# Patient Record
Sex: Male | Born: 1995 | Race: Black or African American | Hispanic: No | Marital: Married
Health system: Southern US, Community
[De-identification: ages and names within clinical notes are randomized; demographics above are authoritative.]

---
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
## 2017-04-26 ENCOUNTER — Ambulatory Visit: Payer: Self-pay | Admitting: Nurse Practitioner

## 2017-04-26 VITALS — BP 128/84 | HR 88 | Temp 98.3°F | Resp 20 | Wt 357.6 lb

## 2017-04-26 DIAGNOSIS — L02416 Cutaneous abscess of left lower limb: Secondary | ICD-10-CM

## 2017-04-26 MED ORDER — SULFAMETHOXAZOLE-TRIMETHOPRIM 800-160 MG PO TABS: 1.0000 | ORAL_TABLET | Freq: Two times a day (BID) | ORAL | 0 refills | Status: AC

## 2017-04-26 MED ORDER — MUPIROCIN 2 % EX OINT: 1.0000 "application " | TOPICAL_OINTMENT | Freq: Two times a day (BID) | CUTANEOUS | 0 refills | Status: AC

## 2017-04-26 MED ORDER — CEPHALEXIN 500 MG PO CAPS: 500.0000 mg | ORAL_CAPSULE | Freq: Three times a day (TID) | ORAL | 0 refills | Status: AC

## 2017-04-26 NOTE — Patient Instructions (Addendum)

## 2017-04-26 NOTE — Progress Notes (Signed)
Subjective:    Anthony Lozano is a 22 y.o. male who presents for evaluation of a possible skin infection located posterior left knee/leg. Symptoms include mild pain and erythema located to back of left knee. Patient denies chills and fever greater than 100. Precipitating event: patient worked at [employer] prior to noticing symptoms. Treatment to date has included warm compresses and dressing applied with minimal relief.  Patient states he noticed something that looked like a "pimple" behind the left knee about 2 days ago. Site began draining x 1 day.   The following portions of the patient's history were reviewed and updated as appropriate: allergies, current medications and past medical history.  Review of Systems Constitutional: negative Eyes: negative Ears, nose, mouth, throat, and face: negative Respiratory: negative Cardiovascular: negative Gastrointestinal: negative Integument/breast: positive for abscess to left posterior knee Musculoskeletal:negative Behavioral/Psych: negative     Objective:    BP 128/84 (BP Location: Right Arm, Patient Position: Sitting, Cuff Size: Normal)   Pulse 88   Temp 98.3 F (36.8 C) (Oral)   Resp 20   Wt (!) 357 lb 9.6 oz (162.2 kg)   SpO2 98%  General appearance: alert, cooperative and no distress Head: Normocephalic, without obvious abnormality, atraumatic Eyes: conjunctivae/corneas clear. PERRL, EOM's intact. Fundi benign. Ears: normal TM's and external ear canals both ears Nose: Nares normal. Septum midline. Mucosa normal. No drainage or sinus tenderness. Throat: lips, mucosa, and tongue normal; teeth and gums normal Lungs: clear to auscultation bilaterally Heart: regular rate and rhythm, S1, S2 normal, no murmur, click, rub or gallop Abdomen: soft, non-tender; bowel sounds normal; no masses,  no organomegaly Extremities: extremities normal, atraumatic, no cyanosis or edema and 3-4 cm abscess to posterior left knee, currently draining  serosanguinous fluid, no order, area surrounding wound, warm to palpation, site is firm, no fluctuance. No malodorous drainage observed. Pulses: 2+ and symmetric Skin: abscess to posterior left knee Lymph nodes: cervical and submandibular nodes normal     Assessment:    Skin Abscess to Left Lower Extremity  Plan:    Bactrim, Keflex and Mupirocin prescribed. Agricultural engineerducational material distributed. Pain medication: Ibuprofen or Tylenol. Wound dressing applied. Follow up in 2 days for wound check. Patient instructed to use Hibiclens soap or Dial antibacterial soap to clean site twice daily.  Patient to apply clean dressings to site daily.  Discussed with patient indications to go to ER, fever, chills, foul smelling drainage, streaking or other concerns.  Patient verbalized understanding.

## 2017-04-28 ENCOUNTER — Telehealth: Payer: Self-pay | Admitting: Emergency Medicine

## 2017-11-20 NOTE — Telephone Encounter (Signed)
error 

## 2017-12-23 ENCOUNTER — Emergency Department (HOSPITAL_COMMUNITY): Admission: EM | Admit: 2017-12-23 | Discharge: 2017-12-24 | Discharge status: Against Medical Advice | Payer: Self-pay

## 2017-12-24 NOTE — ED Notes (Signed)
No answer for triage x3 

## 2017-12-29 ENCOUNTER — Ambulatory Visit: Payer: Self-pay | Admitting: Family Medicine

## 2017-12-29 ENCOUNTER — Encounter: Payer: Self-pay | Admitting: Family Medicine

## 2017-12-29 VITALS — BP 125/80 | HR 84 | Temp 99.0°F | Resp 18 | Wt 349.8 lb

## 2017-12-29 DIAGNOSIS — L309 Dermatitis, unspecified: Secondary | ICD-10-CM

## 2017-12-29 MED ORDER — MOMETASONE FUROATE 0.1 % EX CREA: 1.0000 "application " | TOPICAL_CREAM | Freq: Every day | CUTANEOUS | 0 refills | Status: AC

## 2017-12-29 NOTE — Patient Instructions (Signed)

## 2017-12-29 NOTE — Progress Notes (Signed)
Anthony Lozano is a 22 y.o. male who presents today with concerns of eczema that is chronic and has persisted since his youth about 22 years old. He has never had this condition formally evaluated or treated. He reports using moisturizing lotion on this location to treat the dryness for the last few years. He has never had formal diagnosis or treatment of this condition.  Review of Systems  Constitutional: Negative for chills, fever and malaise/fatigue.  HENT: Negative for congestion, ear discharge, ear pain, sinus pain and sore throat.   Eyes: Negative.   Respiratory: Negative for cough, sputum production and shortness of breath.   Cardiovascular: Negative.  Negative for chest pain.  Gastrointestinal: Negative for abdominal pain, diarrhea, nausea and vomiting.  Genitourinary: Negative for dysuria, frequency, hematuria and urgency.  Musculoskeletal: Negative for myalgias.  Skin: Positive for itching and rash.       Chronic skin condition: eczema  Neurological: Negative for headaches.  Endo/Heme/Allergies: Negative.   Psychiatric/Behavioral: Negative.     O: Vitals:   12/29/17 1439  BP: 125/80  Pulse: 84  Resp: 18  Temp: 99 F (37.2 C)  SpO2: 98%     Physical Exam  Constitutional: He is oriented to person, place, and time. Vital signs are normal. He appears well-developed and well-nourished. He is active.  Non-toxic appearance. He does not have a sickly appearance.  HENT:  Head: Normocephalic.  Right Ear: Hearing, tympanic membrane, external ear and ear canal normal.  Left Ear: Hearing, tympanic membrane, external ear and ear canal normal.  Nose: Nose normal.  Mouth/Throat: Uvula is midline and oropharynx is clear and moist.  Neck: Normal range of motion. Neck supple.  Cardiovascular: Normal rate, regular rhythm, normal heart sounds and normal pulses.  Pulmonary/Chest: Effort normal and breath sounds normal.  Abdominal: Soft. Bowel sounds are normal.  Musculoskeletal:  Normal range of motion.  Lymphadenopathy:       Head (right side): No submental and no submandibular adenopathy present.       Head (left side): No submental and no submandibular adenopathy present.    He has no cervical adenopathy.  Neurological: He is alert and oriented to person, place, and time.  Skin: Rash noted.     Patient has macular plaque like hyperpigmentation distributed on his entire upper body arms, chest, and back,. When examined under the UV light there was no indication of a fungal infection. The areas in question on the chest, back  And neck appeared non scaly and flat hyperpigmented resembling a acanthosis nigrans as compared to both of the exposed arms which thick plaque like lichenification was prevalent especially around the wrist area. There was cracking of some areas but all skin appeared intact without bleeding . Also under both armpits in the axilla there was evidence of scarring similar to hidradenitis supperativa and patient reports occasional painful bumps in axilla and groin and between thighs, that may or may not drain and resolve over time. No active lesions in axilla present at this time.  Psychiatric: He has a normal mood and affect.  Vitals reviewed.  A: 1. Eczema, unspecified type    P: Discussed exam findings, diagnosis etiology and medication use and indications reviewed with patient. Follow- Up and discharge instructions provided. No emergent/urgent issues found on exam.  Patient verbalized understanding of information provided and agrees with plan of care (POC), all questions answered.  Provided patient information on local primary care- when examined wit shirt removed patient appear to have multiple skin  conditions present that would benefit from from chronic management as opposed to episodic care treatment. This was explained to the patient who agrees with recommendations. 1. Eczema, unspecified type - mometasone (ELOCON) 0.1 % cream; Apply 1  application topically daily. Apply to clean, dry, skin of affected area. Discussed the importance of hydration and recommended use of Aquaphor post bath daily and only to use the prescribed steroid during exacerbations not to exceed twice daily application for use to two weeks.  The risk, benefits, and side effects of steroids were discussed and patient verbalized understanding of thinning skin and atrophy.
